# Patient Record
Sex: Female | Born: 1965 | Race: White | Marital: Married
Health system: Southern US, Community
[De-identification: ages and names within clinical notes are randomized; demographics above are authoritative.]

## PROBLEM LIST (undated history)

## (undated) DIAGNOSIS — I10 Essential (primary) hypertension: Secondary | ICD-10-CM

## (undated) HISTORY — DX: Essential (primary) hypertension: I10

---

## 2012-09-16 ENCOUNTER — Other Ambulatory Visit: Payer: Self-pay | Admitting: Physician Assistant

## 2012-09-16 DIAGNOSIS — R51 Headache: Secondary | ICD-10-CM

## 2012-09-18 ENCOUNTER — Ambulatory Visit
Admission: RE | Admit: 2012-09-18 | Discharge: 2012-09-18 | Disposition: A | Discharge status: Home / Self Care | Payer: BC Managed Care – PPO | Source: Ambulatory Visit | Attending: Physician Assistant | Admitting: Physician Assistant

## 2012-09-18 DIAGNOSIS — R51 Headache: Secondary | ICD-10-CM

## 2012-09-18 MED ORDER — IOHEXOL 300 MG/ML  SOLN
75.0000 mL | Freq: Once | INTRAMUSCULAR | Status: AC | PRN
  Administered 2012-09-18: 75 mL via INTRAVENOUS

## 2020-01-19 ENCOUNTER — Other Ambulatory Visit: Payer: Self-pay | Admitting: Obstetrics & Gynecology

## 2020-01-19 DIAGNOSIS — N644 Mastodynia: Secondary | ICD-10-CM

## 2020-02-03 ENCOUNTER — Other Ambulatory Visit: Payer: BC Managed Care – PPO

## 2020-02-10 ENCOUNTER — Ambulatory Visit
Admission: RE | Admit: 2020-02-10 | Discharge: 2020-02-10 | Disposition: A | Discharge status: Home / Self Care | Payer: BC Managed Care – PPO | Source: Ambulatory Visit | Attending: Obstetrics & Gynecology | Admitting: Obstetrics & Gynecology

## 2020-02-10 ENCOUNTER — Ambulatory Visit: Admission: RE | Admit: 2020-02-10 | Payer: BC Managed Care – PPO | Source: Ambulatory Visit

## 2020-02-10 DIAGNOSIS — N644 Mastodynia: Secondary | ICD-10-CM

## 2020-02-25 LAB — COLOGUARD: COLOGUARD: NEGATIVE

## 2020-03-03 ENCOUNTER — Ambulatory Visit
Admission: RE | Admit: 2020-03-03 | Discharge: 2020-03-03 | Disposition: A | Discharge status: Home / Self Care | Payer: BC Managed Care – PPO | Source: Ambulatory Visit | Attending: Obstetrics & Gynecology | Admitting: Obstetrics & Gynecology

## 2020-03-03 ENCOUNTER — Other Ambulatory Visit: Payer: Self-pay

## 2020-03-03 DIAGNOSIS — N644 Mastodynia: Secondary | ICD-10-CM

## 2021-08-09 IMAGING — MG DIGITAL DIAGNOSTIC BILAT W/ TOMO W/ CAD
8 series · 8 of 24 positions shown · non-contrast
Comparison: Previous exam(s).
COMPARISON: Previous exam(s).

Addendum:
CLINICAL DATA: Pain behind the left breast.

EXAM:
DIGITAL DIAGNOSTIC BILATERAL MAMMOGRAM WITH TOMO AND CAD

[L MLO synth-2D]
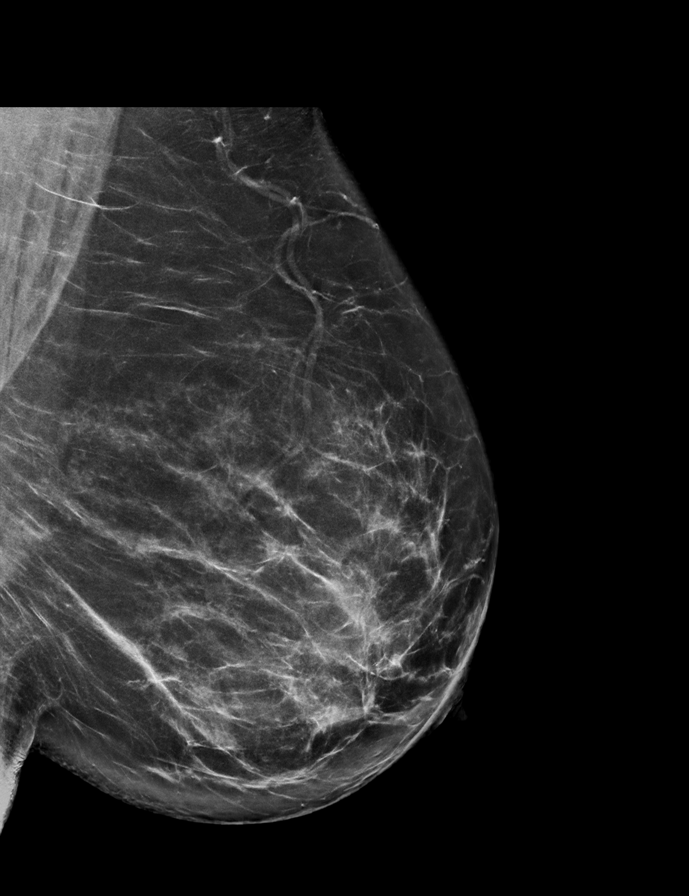

[L CC synth-2D]
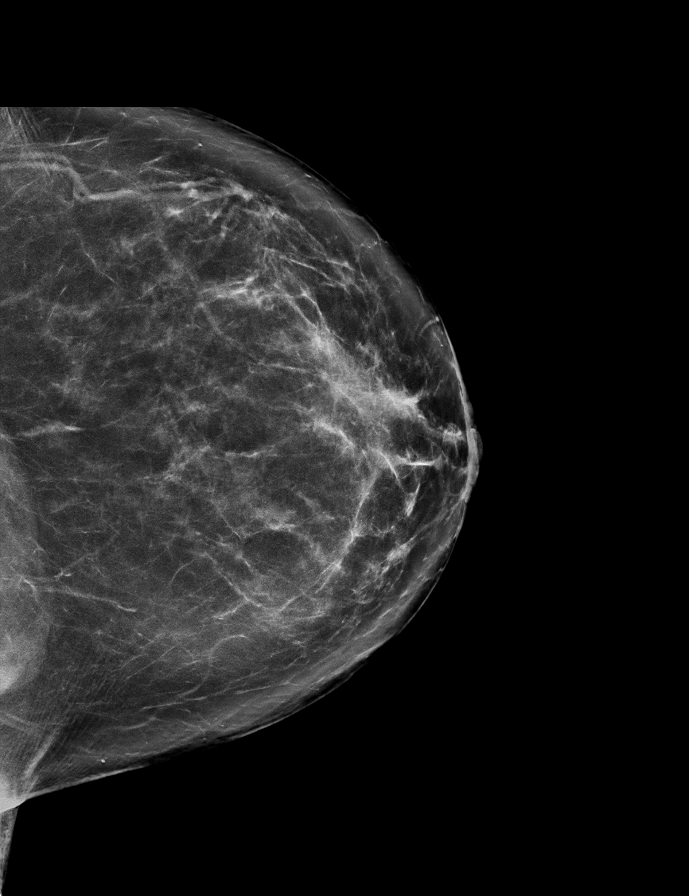

[R CC synth-2D]
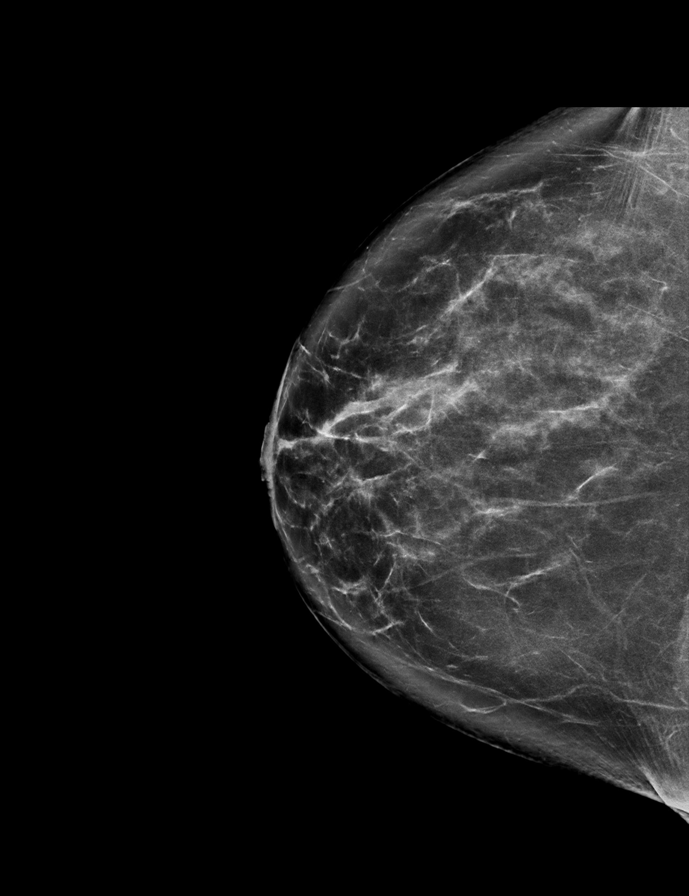

[R MLO synth-2D]
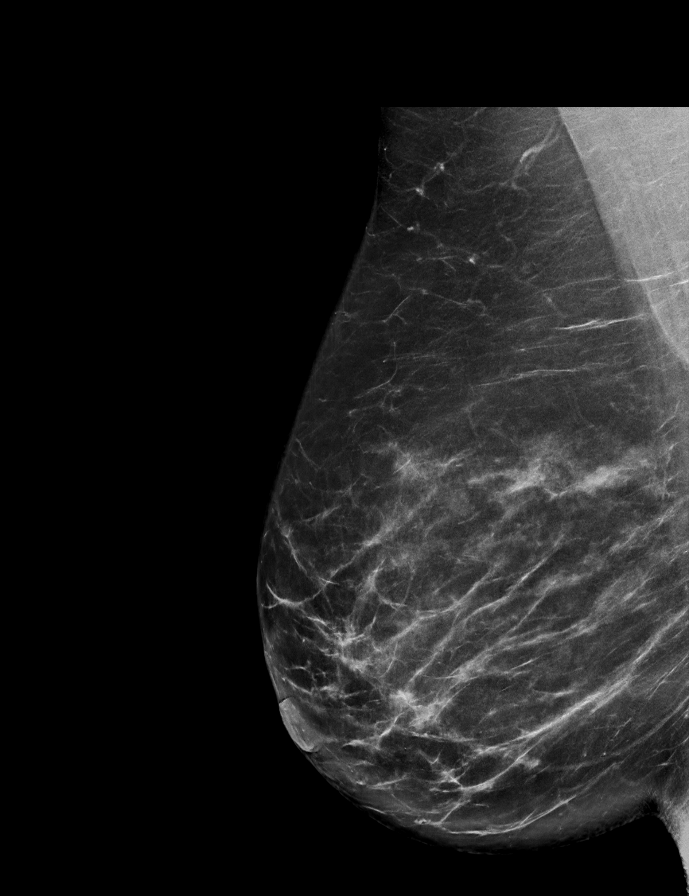

[L CC tomo · tomo slice 41/81.0]
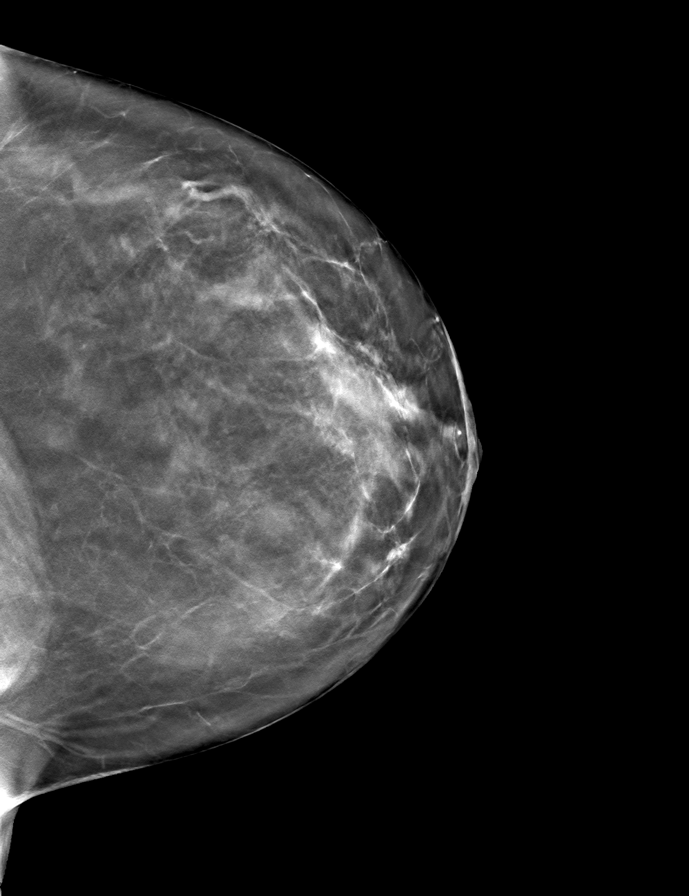

[R MLO tomo · tomo slice 42/83.0]
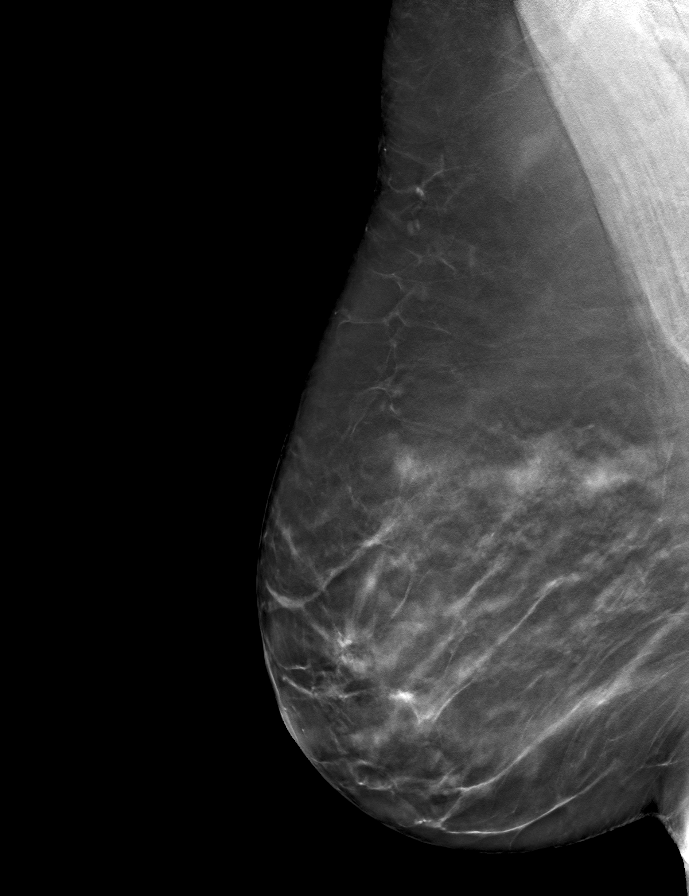

[R CC tomo · tomo slice 42/83.0]
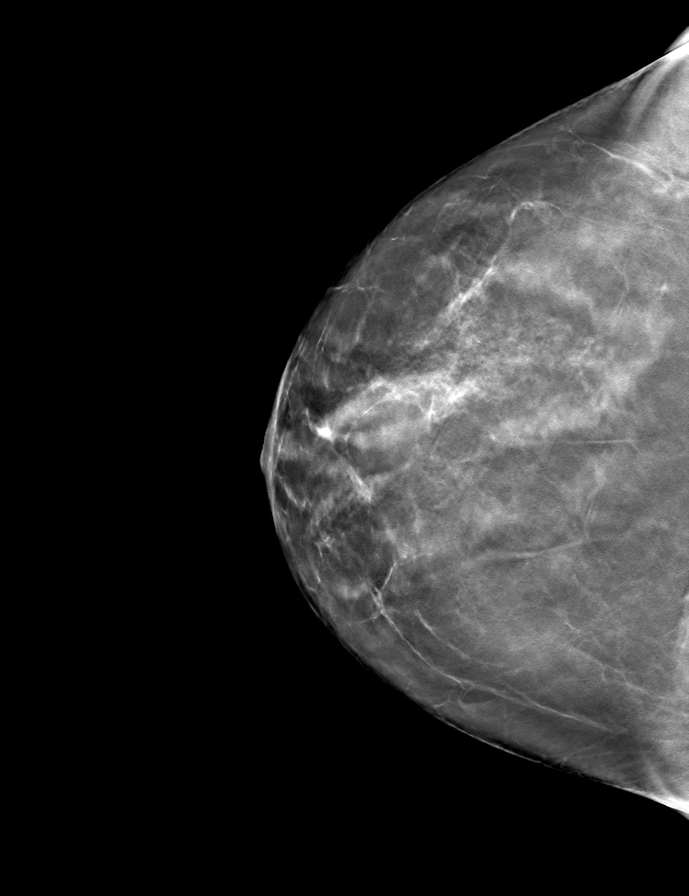

[L MLO tomo · tomo slice 41/82.0]
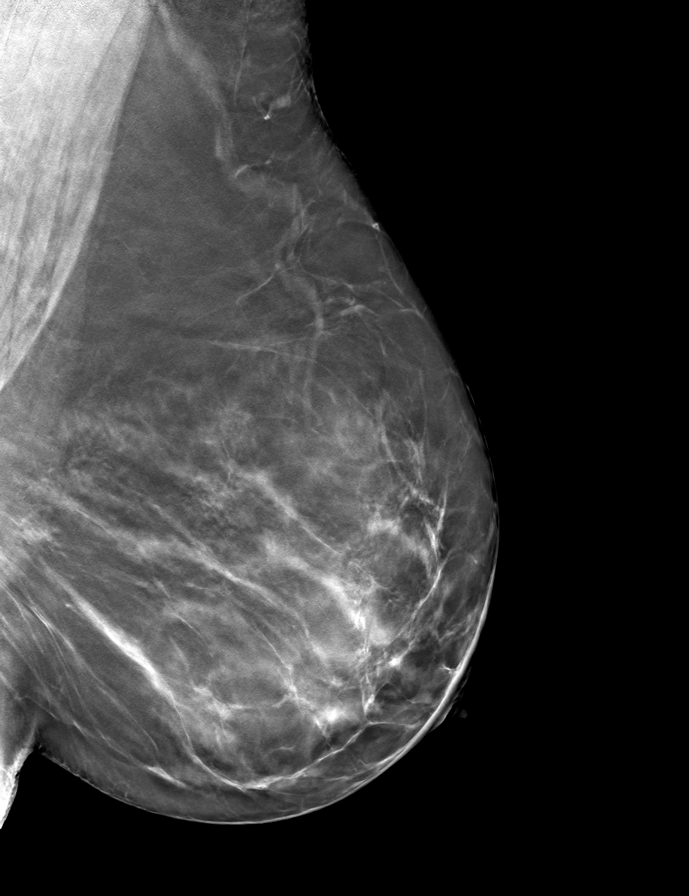

[8 of 24 positions shown; findings below may reference images not displayed]

ACR Breast Density Category b: There are scattered areas of
fibroglandular density.
FINDINGS: There is an asymmetry in the posterior right breast at the level of
the nipple on the MLO view only. No other suspicious findings are
seen in either breast.

Mammographic images were processed with CAD.
IMPRESSION: There is an asymmetry in the posterior right breast at the level of
the nipple on the MLO view only. No other suspicious findings. The
patient left the department due to technical difficulties before the
study was read.

RECOMMENDATION:
The patient should come back for a spot view in the region of the
right breast asymmetry.

I have discussed the findings and recommendations with the patient.
If applicable, a reminder letter will be sent to the patient
regarding the next appointment.

BI-RADS CATEGORY  0: Incomplete. Need additional imaging evaluation
and/or prior mammograms for comparison.

ADDENDUM:
Patient returns today for additional views of the RIGHT breast to
evaluate a possible RIGHT breast asymmetry seen on diagnostic
mammogram dated 02/10/2020.

On today's additional diagnostic views, including spot compression
with 3D tomosynthesis, there is no persistent asymmetry within the
upper RIGHT breast indicating superimposition of normal
fibroglandular tissues.
IMPRESSION: No evidence of malignancy.

RECOMMENDATION: Screening mammogram in one year.(Code:JL-[id number])

BI-RADS CATEGORY 1: Negative.

*** End of Addendum ***
ACR Breast Density Category b: There are scattered areas of
fibroglandular density.
FINDINGS: There is an asymmetry in the posterior right breast at the level of
the nipple on the MLO view only. No other suspicious findings are
seen in either breast.

Mammographic images were processed with CAD.
IMPRESSION: There is an asymmetry in the posterior right breast at the level of
the nipple on the MLO view only. No other suspicious findings. The
patient left the department due to technical difficulties before the
study was read.

RECOMMENDATION:
The patient should come back for a spot view in the region of the
right breast asymmetry.

I have discussed the findings and recommendations with the patient.
If applicable, a reminder letter will be sent to the patient
regarding the next appointment.

BI-RADS CATEGORY  0: Incomplete. Need additional imaging evaluation
and/or prior mammograms for comparison.

## 2022-05-17 ENCOUNTER — Encounter (HOSPITAL_BASED_OUTPATIENT_CLINIC_OR_DEPARTMENT_OTHER): Payer: Self-pay | Admitting: Pediatrics

## 2022-05-17 ENCOUNTER — Other Ambulatory Visit: Payer: Self-pay

## 2022-05-17 ENCOUNTER — Emergency Department (HOSPITAL_BASED_OUTPATIENT_CLINIC_OR_DEPARTMENT_OTHER): Payer: BC Managed Care – PPO

## 2022-05-17 ENCOUNTER — Emergency Department (HOSPITAL_BASED_OUTPATIENT_CLINIC_OR_DEPARTMENT_OTHER)
Admission: EM | Admit: 2022-05-17 | Discharge: 2022-05-17 | Disposition: A | Discharge status: Home / Self Care | Payer: BC Managed Care – PPO | Attending: Emergency Medicine | Admitting: Emergency Medicine

## 2022-05-17 DIAGNOSIS — R0789 Other chest pain: Secondary | ICD-10-CM | POA: Insufficient documentation

## 2022-05-17 DIAGNOSIS — M25512 Pain in left shoulder: Secondary | ICD-10-CM | POA: Insufficient documentation

## 2022-05-17 DIAGNOSIS — M545 Low back pain, unspecified: Secondary | ICD-10-CM | POA: Diagnosis not present

## 2022-05-17 DIAGNOSIS — R079 Chest pain, unspecified: Secondary | ICD-10-CM

## 2022-05-17 LAB — BASIC METABOLIC PANEL
Anion gap: 10 (ref 5–15)
BUN: 14 mg/dL (ref 6–20)
CO2: 23 mmol/L (ref 22–32)
Calcium: 8.6 mg/dL — ABNORMAL LOW (ref 8.9–10.3)
Chloride: 102 mmol/L (ref 98–111)
Creatinine, Ser: 0.81 mg/dL (ref 0.44–1.00)
GFR, Estimated: >60 mL/min (ref >60)
Glucose, Bld: 90 mg/dL (ref 70–99)
Potassium: 3.6 mmol/L (ref 3.5–5.1)
Sodium: 135 mmol/L (ref 135–145)

## 2022-05-17 LAB — CBC
HCT: 39.5 % (ref 36.0–46.0)
Hemoglobin: 13.4 g/dL (ref 12.0–15.0)
MCH: 30.7 pg (ref 26.0–34.0)
MCHC: 33.9 g/dL (ref 30.0–36.0)
MCV: 90.4 fL (ref 80.0–100.0)
Platelets: 317 10*3/uL (ref 150–400)
RBC: 4.37 MIL/uL (ref 3.87–5.11)
RDW: 12.2 % (ref 11.5–15.5)
WBC: 9.6 10*3/uL (ref 4.0–10.5)
nRBC: 0 % (ref 0.0–0.2)

## 2022-05-17 LAB — TROPONIN I (HIGH SENSITIVITY)
Troponin I (High Sensitivity): 2 ng/L (ref <18)
Troponin I (High Sensitivity): 2 ng/L (ref <18)

## 2022-05-17 LAB — D-DIMER, QUANTITATIVE: D-Dimer, Quant: 1.89 ug/mL-FEU — ABNORMAL HIGH (ref 0.00–0.50)

## 2022-05-17 MED ORDER — IOHEXOL 350 MG/ML SOLN
75.0000 mL | Freq: Once | INTRAVENOUS | Status: AC | PRN
  Administered 2022-05-17: 75 mL via INTRAVENOUS

## 2022-05-17 NOTE — ED Provider Notes (Signed)
Patient signed out to me at 1500 by Dr. Wallace Cullens pending CT PE study.  In short this is a 56 year old female that presented to the emergency department with sharp left-sided chest and shoulder pain.  Her work-up showed a normal EKG without ischemic changes and troponin negative.  She did have an elevated D-dimer and CT PE study was performed.  It showed no evidence of PE and mild inflammation of the lower airways.  The patient is asymptomatic at this time.  She is stable for discharge home with primary care follow-up and was given strict return precautions.   Rexford Maus, DO 05/17/22 1610

## 2022-05-17 NOTE — ED Provider Notes (Signed)
[facility] EMERGENCY DEPARTMENT Provider Note   CSN: [id number] Arrival date & time: 05/17/22  1145     History  Chief Complaint  Patient presents with   Shoulder Pain    Valerie Zimmerman is a 56 y.o. female.  Patient is a 56 year old female with no significant past medical history presenting for shoulder blade pain.  Patient admits to shoulder blade pain that radiated to her left chest that was acute in onset and began while standing in front of the friends today.  States pain was severe but has significantly improved at this time.  Denies any diaphoresis or nausea.  Denies any shortness of breath or difficulty breathing.  Denies any fevers, chills, coughing.  Denies a history of pulmonary embolism, DVT, lower extremity swelling, calf pain, or blood clotting disorders.  Patient has not had any recent travel, surgeries, or hormone therapy use.  The history is provided by the patient. No language interpreter was used.  Shoulder Pain Associated symptoms: back pain   Associated symptoms: no fever        Home Medications Prior to Admission medications   Not on File      Allergies    Penicillins    Review of Systems   Review of Systems  Constitutional:  Negative for chills and fever.  HENT:  Negative for ear pain and sore throat.   Eyes:  Negative for pain and visual disturbance.  Respiratory:  Negative for cough and shortness of breath.   Cardiovascular:  Positive for chest pain. Negative for palpitations.  Gastrointestinal:  Negative for abdominal pain and vomiting.  Genitourinary:  Negative for dysuria and hematuria.  Musculoskeletal:  Positive for back pain. Negative for arthralgias.  Skin:  Negative for color change and rash.  Neurological:  Negative for seizures and syncope.  All other systems reviewed and are negative.   Physical Exam Updated Vital Signs BP 111/78   Pulse 90   Temp 98.2 F (36.8 C)   Resp 18   Ht 5\' 2"  (1.575 m)   Wt 72.6 kg    LMP 08/28/2012   SpO2 98%   BMI 29.26 kg/m  Physical Exam Vitals and nursing note reviewed.  Constitutional:      General: She is not in acute distress.    Appearance: She is well-developed.  HENT:     Head: Normocephalic and atraumatic.  Eyes:     Conjunctiva/sclera: Conjunctivae normal.  Cardiovascular:     Rate and Rhythm: Normal rate and regular rhythm.     Pulses:          Radial pulses are 2+ on the right side and 2+ on the left side.     Heart sounds: No murmur heard. Pulmonary:     Effort: Pulmonary effort is normal. No respiratory distress.     Breath sounds: Normal breath sounds.  Abdominal:     Palpations: Abdomen is soft.     Tenderness: There is no abdominal tenderness.  Musculoskeletal:        General: No swelling.     Cervical back: Neck supple.     Right lower leg: No edema.     Left lower leg: No edema.  Skin:    General: Skin is warm and dry.     Capillary Refill: Capillary refill takes less than 2 seconds.  Neurological:     Mental Status: She is alert.  Psychiatric:        Mood and Affect: Mood normal.  ED Results / Procedures / Treatments   Labs (all labs ordered are listed, but only abnormal results are displayed) Labs Reviewed  BASIC METABOLIC PANEL - Abnormal; Notable for the following components:      Result Value   Calcium 8.6 (*)    All other components within normal limits  D-DIMER, QUANTITATIVE - Abnormal; Notable for the following components:   D-Dimer, Quant 1.89 (*)    All other components within normal limits  CBC  TROPONIN I (HIGH SENSITIVITY)  TROPONIN I (HIGH SENSITIVITY)    EKG EKG Interpretation  Date/Time:  Friday May 17 2022 12:08:10 EST Ventricular Rate:  84 PR Interval:  132 QRS Duration: 72 QT Interval:  356 QTC Calculation: 420 R Axis:   29 Text Interpretation: Normal sinus rhythm Low voltage QRS Borderline ECG No previous ECGs available Confirmed by Edwin Dada ([id number]) on 05/17/2022 2:26:30  PM  Radiology No results found.  Procedures Procedures    Medications Ordered in ED Medications  iohexol (OMNIPAQUE) 350 MG/ML injection 75 mL (75 mLs Intravenous Contrast Given 05/17/22 1525)    ED Course/ Medical Decision Making/ A&P                           Medical Decision Making Amount and/or Complexity of Data Reviewed Labs: ordered. Radiology: ordered.  Risk Prescription drug management.   44:45 PM 56 year old female with no significant past medical history presenting for shoulder blade pain.  Patient is alert and oriented x 3, no acute distress, afebrile, stable to signs.  Physical exam demonstrates no reproducible tenderness on the shoulder blade or the chest wall.  No rashes or signs of herpes zoster.  Patient has equal bilateral breath sounds with no adventitious lung sounds.  No heart murmurs.  EKG demonstrates no ST segment elevation or depression.  No T wave inversions.  Normal axis.  Troponin within normal limits on initial evaluation.  Repeat troponin pending.  Stable electrolytes.  Chest x-ray demonstrates no pneumonia, no focal opacities, no masses, no pneumothorax.  Patient is PERC negative however would like to be evaluated for pulmonary embolism today.  D-dimer elevated.  CT PE pending.  Patient signed out to oncoming provider while awaiting results.        Final Clinical Impression(s) / ED Diagnoses Final diagnoses:  Chest pain, unspecified type    Rx / DC Orders ED Discharge Orders     None         Franne Forts, DO 05/23/22 1503

## 2022-05-17 NOTE — ED Triage Notes (Signed)
C/o sudden onset of pain the back of her shoulder while at rest. Stated pain moved to front area at times;

## 2023-03-14 LAB — COLOGUARD: COLOGUARD: NEGATIVE
